# Patient Record
Sex: Male | Born: 1960 | Race: White | Hispanic: No | Marital: Married | Smoking: Never smoker
Health system: Southern US, Community
[De-identification: ages and names within clinical notes are randomized; demographics above are authoritative.]

## PROBLEM LIST (undated history)

## (undated) DIAGNOSIS — I10 Essential (primary) hypertension: Secondary | ICD-10-CM

## (undated) HISTORY — PX: KNEE SURGERY: SHX244

## (undated) HISTORY — PX: BACK SURGERY: SHX140

## (undated) HISTORY — PX: CARPAL TUNNEL RELEASE: SHX101

---

## 2016-05-27 ENCOUNTER — Encounter (HOSPITAL_COMMUNITY): Payer: Self-pay | Admitting: *Deleted

## 2016-05-27 ENCOUNTER — Emergency Department (HOSPITAL_COMMUNITY)
Admission: EM | Admit: 2016-05-27 | Discharge: 2016-05-28 | Disposition: A | Discharge status: Home / Self Care | Payer: 59 | Attending: Emergency Medicine | Admitting: Emergency Medicine

## 2016-05-27 DIAGNOSIS — Y999 Unspecified external cause status: Secondary | ICD-10-CM | POA: Diagnosis not present

## 2016-05-27 DIAGNOSIS — Y939 Activity, unspecified: Secondary | ICD-10-CM | POA: Insufficient documentation

## 2016-05-27 DIAGNOSIS — Y929 Unspecified place or not applicable: Secondary | ICD-10-CM | POA: Insufficient documentation

## 2016-05-27 DIAGNOSIS — I10 Essential (primary) hypertension: Secondary | ICD-10-CM | POA: Insufficient documentation

## 2016-05-27 DIAGNOSIS — W540XXA Bitten by dog, initial encounter: Secondary | ICD-10-CM | POA: Insufficient documentation

## 2016-05-27 DIAGNOSIS — S61255A Open bite of left ring finger without damage to nail, initial encounter: Secondary | ICD-10-CM | POA: Insufficient documentation

## 2016-05-27 DIAGNOSIS — T148XXA Other injury of unspecified body region, initial encounter: Secondary | ICD-10-CM

## 2016-05-27 HISTORY — DX: Essential (primary) hypertension: I10

## 2016-05-27 NOTE — ED Notes (Signed)
Pt was bit on the left ring finger and right and and wrist by his dog. [employer] notified.

## 2016-05-28 MED ORDER — IBUPROFEN 800 MG PO TABS
800.0000 mg | ORAL_TABLET | Freq: Once | ORAL | Status: AC
  Administered 2016-05-28: 800 mg via ORAL
  Filled 2016-05-28: qty 1

## 2016-05-28 MED ORDER — TETANUS-DIPHTH-ACELL PERTUSSIS 5-2.5-18.5 LF-MCG/0.5 IM SUSP
0.5000 mL | Freq: Once | INTRAMUSCULAR | Status: AC
  Administered 2016-05-28: 0.5 mL via INTRAMUSCULAR
  Filled 2016-05-28: qty 0.5

## 2016-05-28 MED ORDER — AMOXICILLIN-POT CLAVULANATE 875-125 MG PO TABS
1.0000 | ORAL_TABLET | Freq: Once | ORAL | Status: AC
  Administered 2016-05-28: 1 via ORAL
  Filled 2016-05-28: qty 1

## 2016-05-28 MED ORDER — AMOXICILLIN-POT CLAVULANATE 875-125 MG PO TABS: 1.0000 | ORAL_TABLET | Freq: Two times a day (BID) | ORAL | Status: AC

## 2016-05-28 NOTE — Discharge Instructions (Signed)
Please allow the steri-strips to come off on there own. Use augmentin 2 times daily with food. Use tylenol or ilbuprofen for soreness. Please return to the ED if any signs of advancing infection. Your tetanus was updated, please mark you records.

## 2016-05-28 NOTE — ED Provider Notes (Signed)
CSN: [id number]     Arrival date & time 05/27/16  2234 History   First MD Initiated Contact with Patient 05/27/16 2324     Chief Complaint  Patient presents with  . Animal Bite     (Consider location/radiation/quality/duration/timing/severity/associated sxs/prior Treatment) HPI Comments: Patient is a 55 year old male who presents to the emergency department with dog bites to the on left ring finger, and right wrist. This happened just prior to arrival to the emergency department. The patient is the owner of the dog, and he states that the dog is up-to-date on all immunizations. Patient states he is not completely sure of when his last tetanus shot was. Patient has some pain of the fingers and wrist, but states he has good movement of everything. There was no other injury reported. The injury was on provoked as the patient entered the dog's space.  The history is provided by the patient.    Past Medical History  Diagnosis Date  . Hypertension    Past Surgical History  Procedure Laterality Date  . Back surgery    . Knee surgery    . Carpal tunnel release     History reviewed. No pertinent family history. Social History  Substance Use Topics  . Smoking status: Never Smoker   . Smokeless tobacco: None  . Alcohol Use: No    Review of Systems  Constitutional: Negative for activity change.       All ROS Neg except as noted in HPI  HENT: Negative for nosebleeds.   Eyes: Negative for photophobia and discharge.  Respiratory: Negative for cough, shortness of breath and wheezing.   Cardiovascular: Negative for chest pain and palpitations.  Gastrointestinal: Negative for abdominal pain and blood in stool.  Genitourinary: Negative for dysuria, frequency and hematuria.  Musculoskeletal: Negative for back pain, arthralgias and neck pain.  Skin: Negative.   Neurological: Negative for dizziness, seizures and speech difficulty.  Psychiatric/Behavioral: Negative for hallucinations and  confusion.      Allergies  Review of patient's allergies indicates no known allergies.  Home Medications   Prior to Admission medications   Not on File   BP 147/88 mmHg  Pulse 95  Temp(Src) 98.2 F (36.8 C) (Oral)  Resp 18  Ht 5\' 8"  (1.727 m)  Wt 91.627 kg  BMI 30.72 kg/m2  SpO2 98% Physical Exam  Constitutional: He is oriented to person, place, and time. He appears well-developed and well-nourished.  Non-toxic appearance.  HENT:  Head: Normocephalic.  Right Ear: Tympanic membrane and external ear normal.  Left Ear: Tympanic membrane and external ear normal.  Eyes: EOM and lids are normal. Pupils are equal, round, and reactive to light.  Neck: Normal range of motion. Neck supple. Carotid bruit is not present.  Cardiovascular: Normal rate, regular rhythm, normal heart sounds, intact distal pulses and normal pulses.   Pulmonary/Chest: Breath sounds normal. No respiratory distress.  Abdominal: Soft. Bowel sounds are normal. There is no tenderness. There is no guarding.  Musculoskeletal: Normal range of motion.       Hands: Lymphadenopathy:       Head (right side): No submandibular adenopathy present.       Head (left side): No submandibular adenopathy present.    He has no cervical adenopathy.  Neurological: He is alert and oriented to person, place, and time. He has normal strength. No cranial nerve deficit or sensory deficit.  Skin: Skin is warm and dry.  Psychiatric: He has a normal mood and affect. His speech is  normal.  Nursing note and vitals reviewed.   ED Course  Procedures (including critical care time) Labs Review Labs Reviewed - No data to display  Imaging Review No results found. I have personally reviewed and evaluated these images and lab results as part of my medical decision-making.   EKG Interpretation None      MDM Vital signs within normal limits. There is no bone or tendon involvement. Patient has good range of motion of the finger and  wrist.  Pt reports his dog is up to date on all shots.  The patient will be treated with Augmentin. His tetanus status has been updated. He is advised to change his dressings daily. He will see his primary physician, or return to the emergency department if any signs of any infection. Patient is in agreement with this discharge plan.    Final diagnoses:  Animal bites    *I have reviewed nursing notes, vital signs, and all appropriate lab and imaging results for this patient.**    Ivery QualeHobson Marcee Jacobs, PA-C 05/31/16 0038  Ivery QualeHobson Londin Antone, PA-C 06/01/16 1134  Devoria AlbeIva Knapp, MD 06/01/16 2259

## 2018-02-09 ENCOUNTER — Encounter (HOSPITAL_COMMUNITY): Payer: Self-pay | Admitting: Emergency Medicine

## 2018-02-09 ENCOUNTER — Other Ambulatory Visit: Payer: Self-pay

## 2018-02-09 ENCOUNTER — Emergency Department (HOSPITAL_COMMUNITY)
Admission: EM | Admit: 2018-02-09 | Discharge: 2018-02-09 | Disposition: A | Discharge status: Home / Self Care | Payer: 59 | Attending: Emergency Medicine | Admitting: Emergency Medicine

## 2018-02-09 ENCOUNTER — Emergency Department (HOSPITAL_COMMUNITY): Payer: 59

## 2018-02-09 DIAGNOSIS — Y939 Activity, unspecified: Secondary | ICD-10-CM | POA: Diagnosis not present

## 2018-02-09 DIAGNOSIS — I10 Essential (primary) hypertension: Secondary | ICD-10-CM | POA: Insufficient documentation

## 2018-02-09 DIAGNOSIS — S199XXA Unspecified injury of neck, initial encounter: Secondary | ICD-10-CM | POA: Diagnosis present

## 2018-02-09 DIAGNOSIS — Z87891 Personal history of nicotine dependence: Secondary | ICD-10-CM | POA: Insufficient documentation

## 2018-02-09 DIAGNOSIS — S161XXA Strain of muscle, fascia and tendon at neck level, initial encounter: Secondary | ICD-10-CM | POA: Diagnosis not present

## 2018-02-09 DIAGNOSIS — Y999 Unspecified external cause status: Secondary | ICD-10-CM | POA: Diagnosis not present

## 2018-02-09 DIAGNOSIS — Y92410 Unspecified street and highway as the place of occurrence of the external cause: Secondary | ICD-10-CM | POA: Insufficient documentation

## 2018-02-09 MED ORDER — ONDANSETRON HCL 4 MG PO TABS
4.0000 mg | ORAL_TABLET | Freq: Once | ORAL | Status: AC
  Administered 2018-02-09: 4 mg via ORAL
  Filled 2018-02-09: qty 1

## 2018-02-09 MED ORDER — CYCLOBENZAPRINE HCL 10 MG PO TABS: 10.0000 mg | ORAL_TABLET | Freq: Three times a day (TID) | ORAL | 0 refills | Status: AC

## 2018-02-09 MED ORDER — KETOROLAC TROMETHAMINE 10 MG PO TABS
10.0000 mg | ORAL_TABLET | Freq: Once | ORAL | Status: AC
  Administered 2018-02-09: 10 mg via ORAL
  Filled 2018-02-09: qty 1

## 2018-02-09 MED ORDER — DIAZEPAM 5 MG PO TABS
10.0000 mg | ORAL_TABLET | Freq: Once | ORAL | Status: AC
  Administered 2018-02-09: 10 mg via ORAL
  Filled 2018-02-09: qty 2

## 2018-02-09 NOTE — Discharge Instructions (Addendum)
Your vital signs have been reviewed.  Your pulse oximetry is 99% on room air.  Within normal limits by my interpretation.  A CT scan of your cervical spine area has been performed, and shows your hardware to be in place.  There are no fractures or dislocation appreciated.  There are no gross neurologic deficits appreciated on your examination.  Please continue your current medications.  Please add Flexeril 3 times daily.  Please see Dr. Jodelle RedEldon for additional evaluation and management if any changes in your condition, problems, or concerns.
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
## 2018-02-09 NOTE — ED Notes (Signed)
Advised patient not to drive after discharge due to medication administration. Also advised patient not to drive while taking prescription pain medication. Patient verbalized understanding. Discharged with significant other to drive him home.
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
